# Patient Record
Sex: Male | Born: 1960 | Race: White | Hispanic: No | Marital: Married | Smoking: Current every day smoker
Health system: Southern US, Community
[De-identification: ages and names within clinical notes are randomized; demographics above are authoritative.]

## PROBLEM LIST (undated history)

## (undated) DIAGNOSIS — I10 Essential (primary) hypertension: Secondary | ICD-10-CM

## (undated) DIAGNOSIS — E7800 Pure hypercholesterolemia, unspecified: Secondary | ICD-10-CM

---

## 2010-07-18 ENCOUNTER — Emergency Department: Payer: Self-pay | Admitting: Emergency Medicine

## 2010-11-29 ENCOUNTER — Emergency Department: Payer: Self-pay | Admitting: *Deleted

## 2010-12-04 ENCOUNTER — Emergency Department: Payer: Self-pay | Admitting: Emergency Medicine

## 2011-06-24 ENCOUNTER — Emergency Department: Payer: Self-pay | Admitting: Emergency Medicine

## 2012-03-23 IMAGING — CR DG HAND COMPLETE 3+V*L*
1 series · 3 of 3 positions shown · non-contrast
Comparison: none

REASON FOR EXAM: 3rd finger and hand pain
COMMENTS:   May transport without cardiac monitor

PROCEDURE:     DXR - DXR HAND LT COMPLETE  W/OBLIQUES  - November 29, 2010  [DATE]
RESULT:     Images of the left hand demonstrate no fracture, dislocation or
radiopaque foreign body.

[Series 1: view not recorded · 0.17mm/px · 3 of 3 slices shown]
[im 1/3]
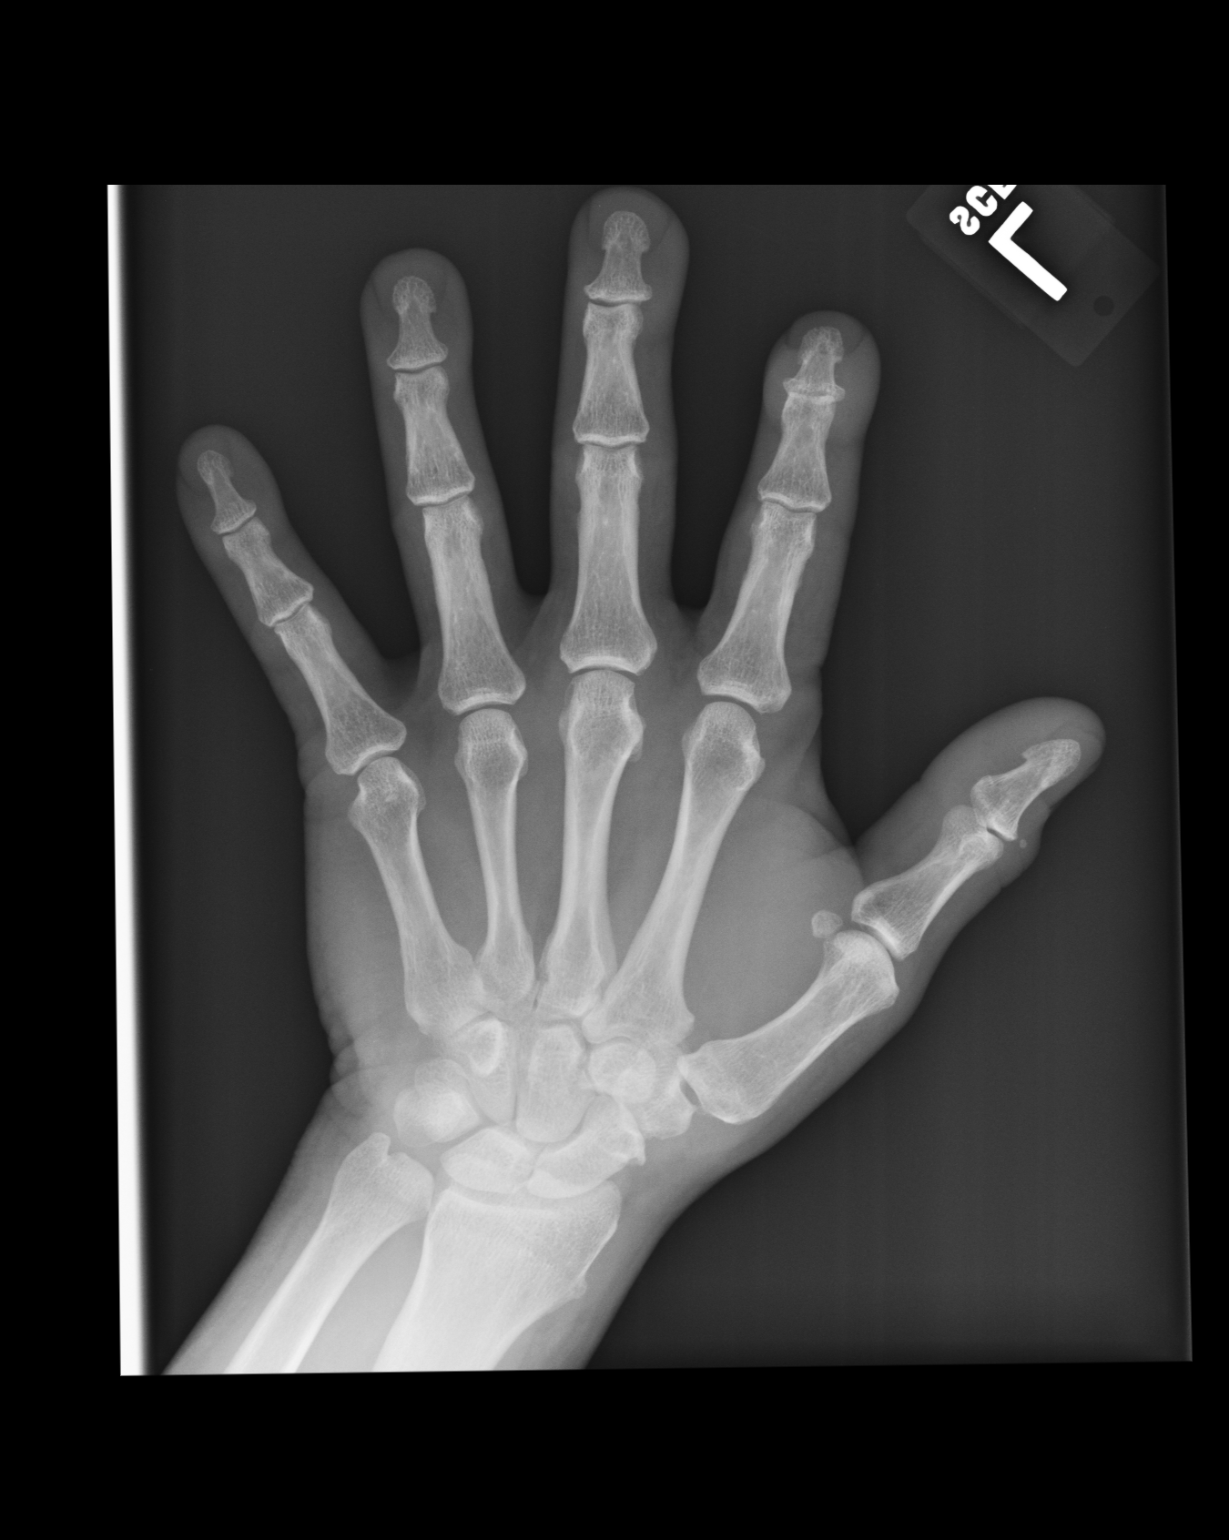
[im 2/3]
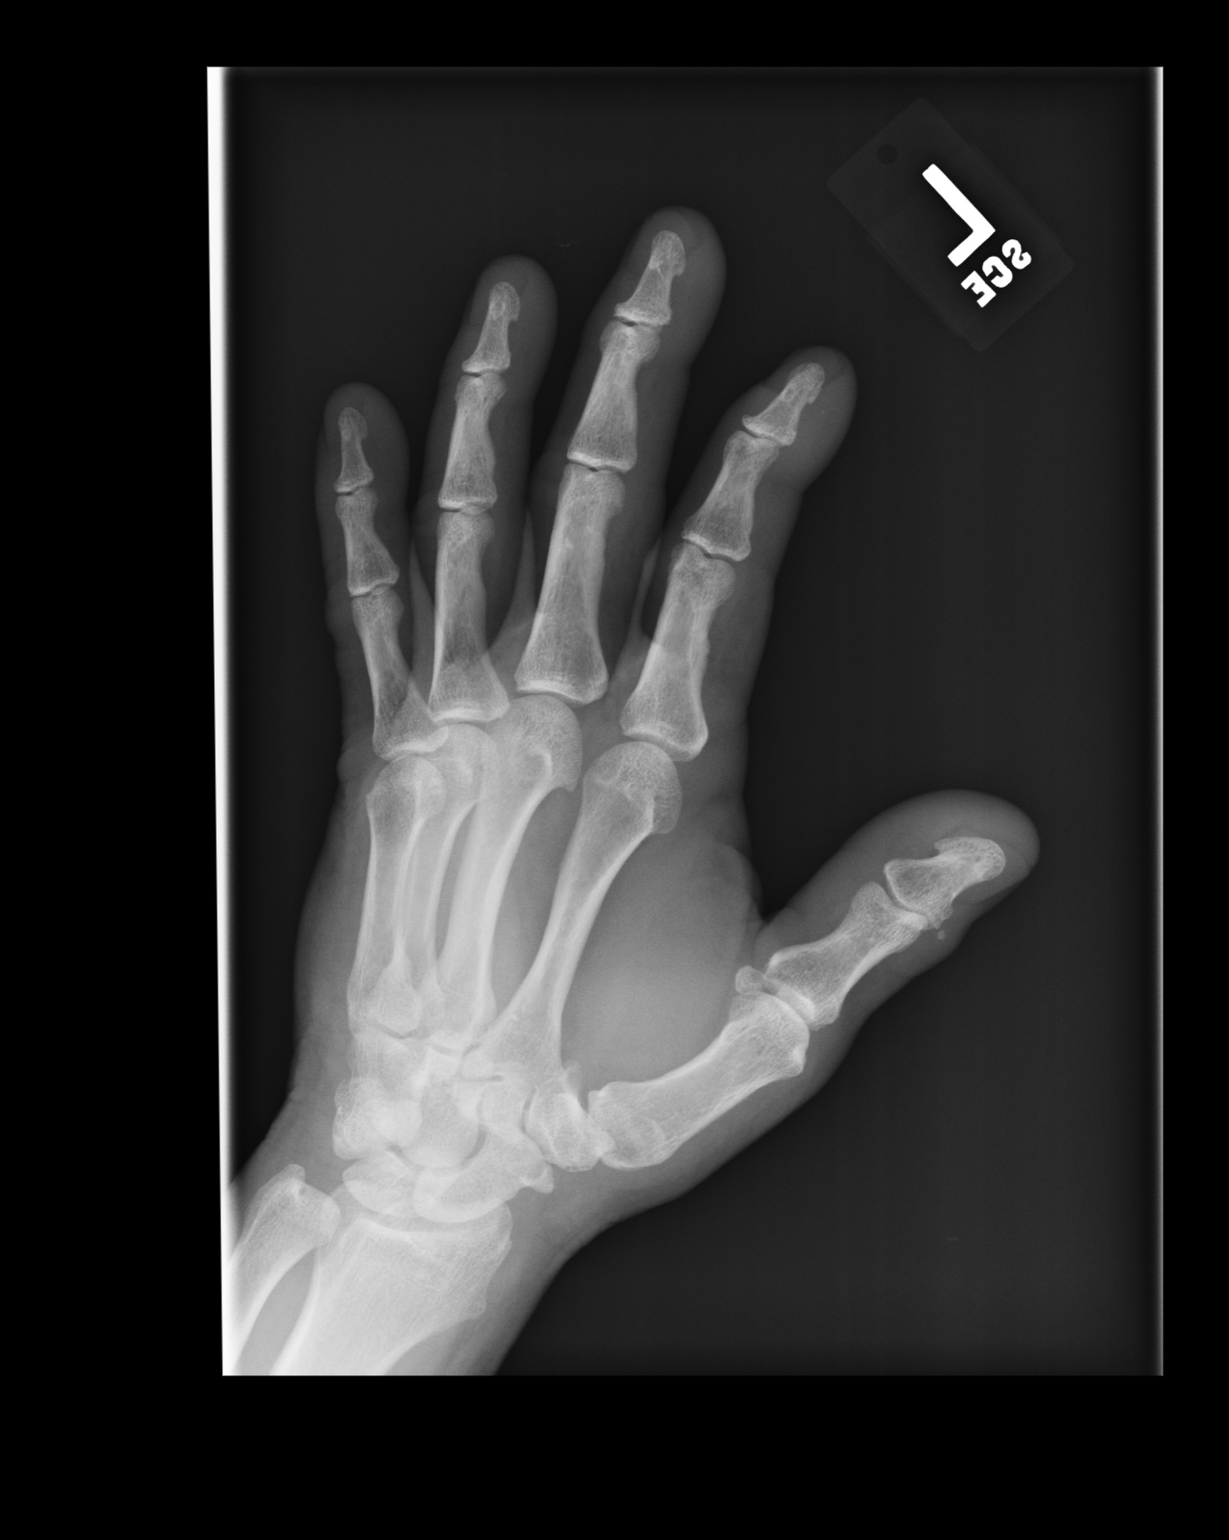
[im 3/3]
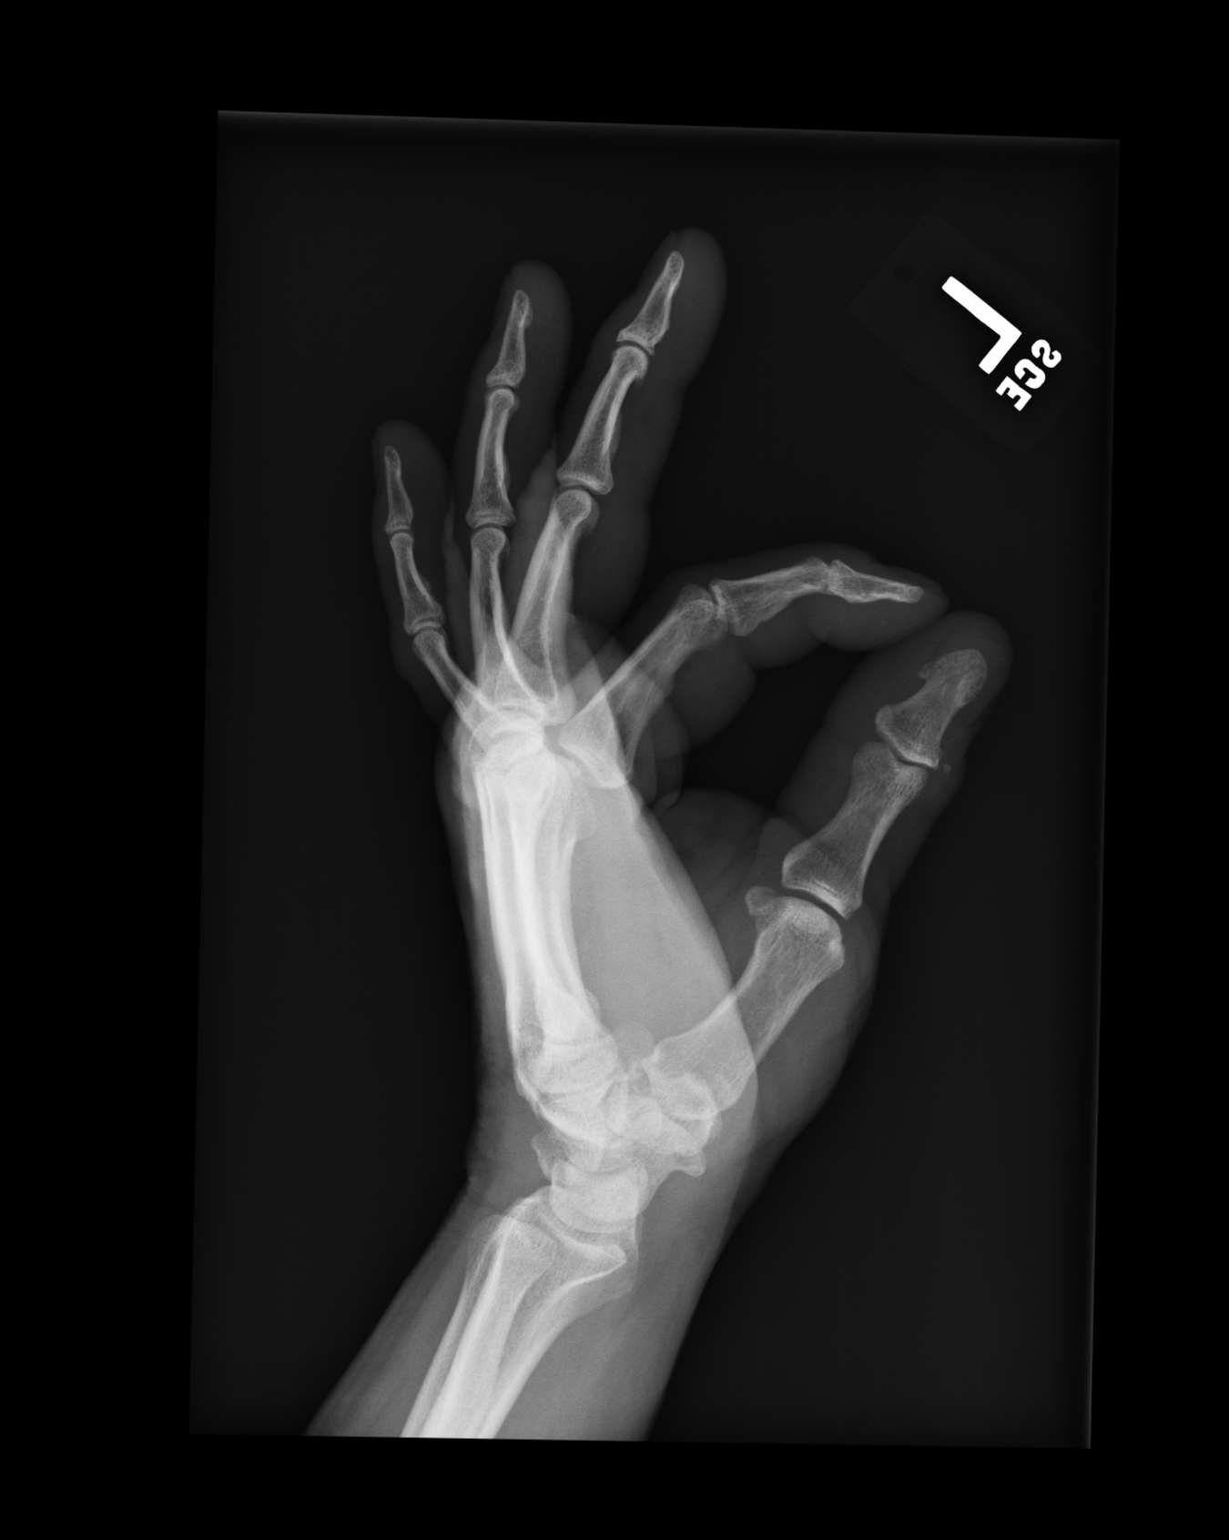

[3 of 3 positions shown; findings below may reference images not displayed]

IMPRESSION: No acute bony abnormality evident.

## 2012-10-16 IMAGING — CR DG CHEST 2V
1 series · 2 of 2 positions shown · non-contrast
Comparison: none

REASON FOR EXAM: weakness
COMMENTS:

PROCEDURE:     DXR - DXR CHEST PA (OR AP) AND LATERAL  - June 24, 2011  [DATE]
RESULT:     Comparison: None.

[Series 1: pa · 0.17mm/px · 2 of 2 slices shown]
[im 1/2]
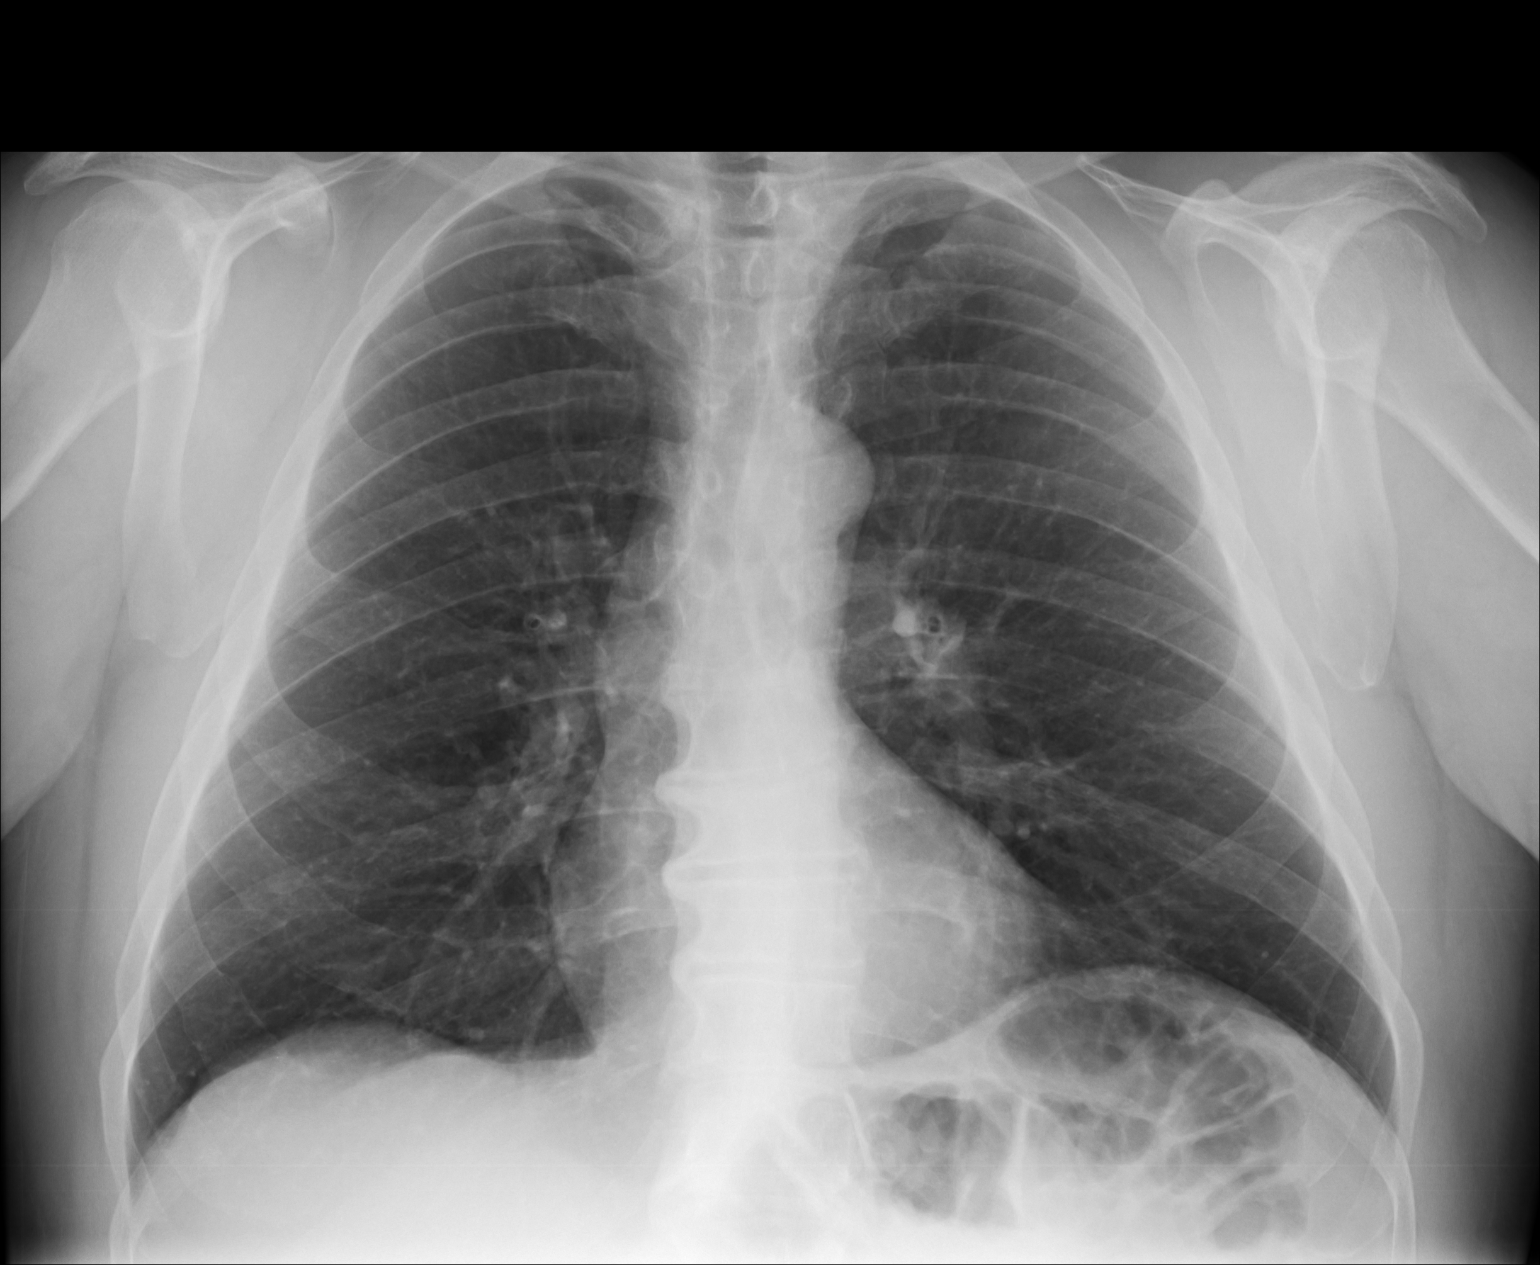
[im 2/2]
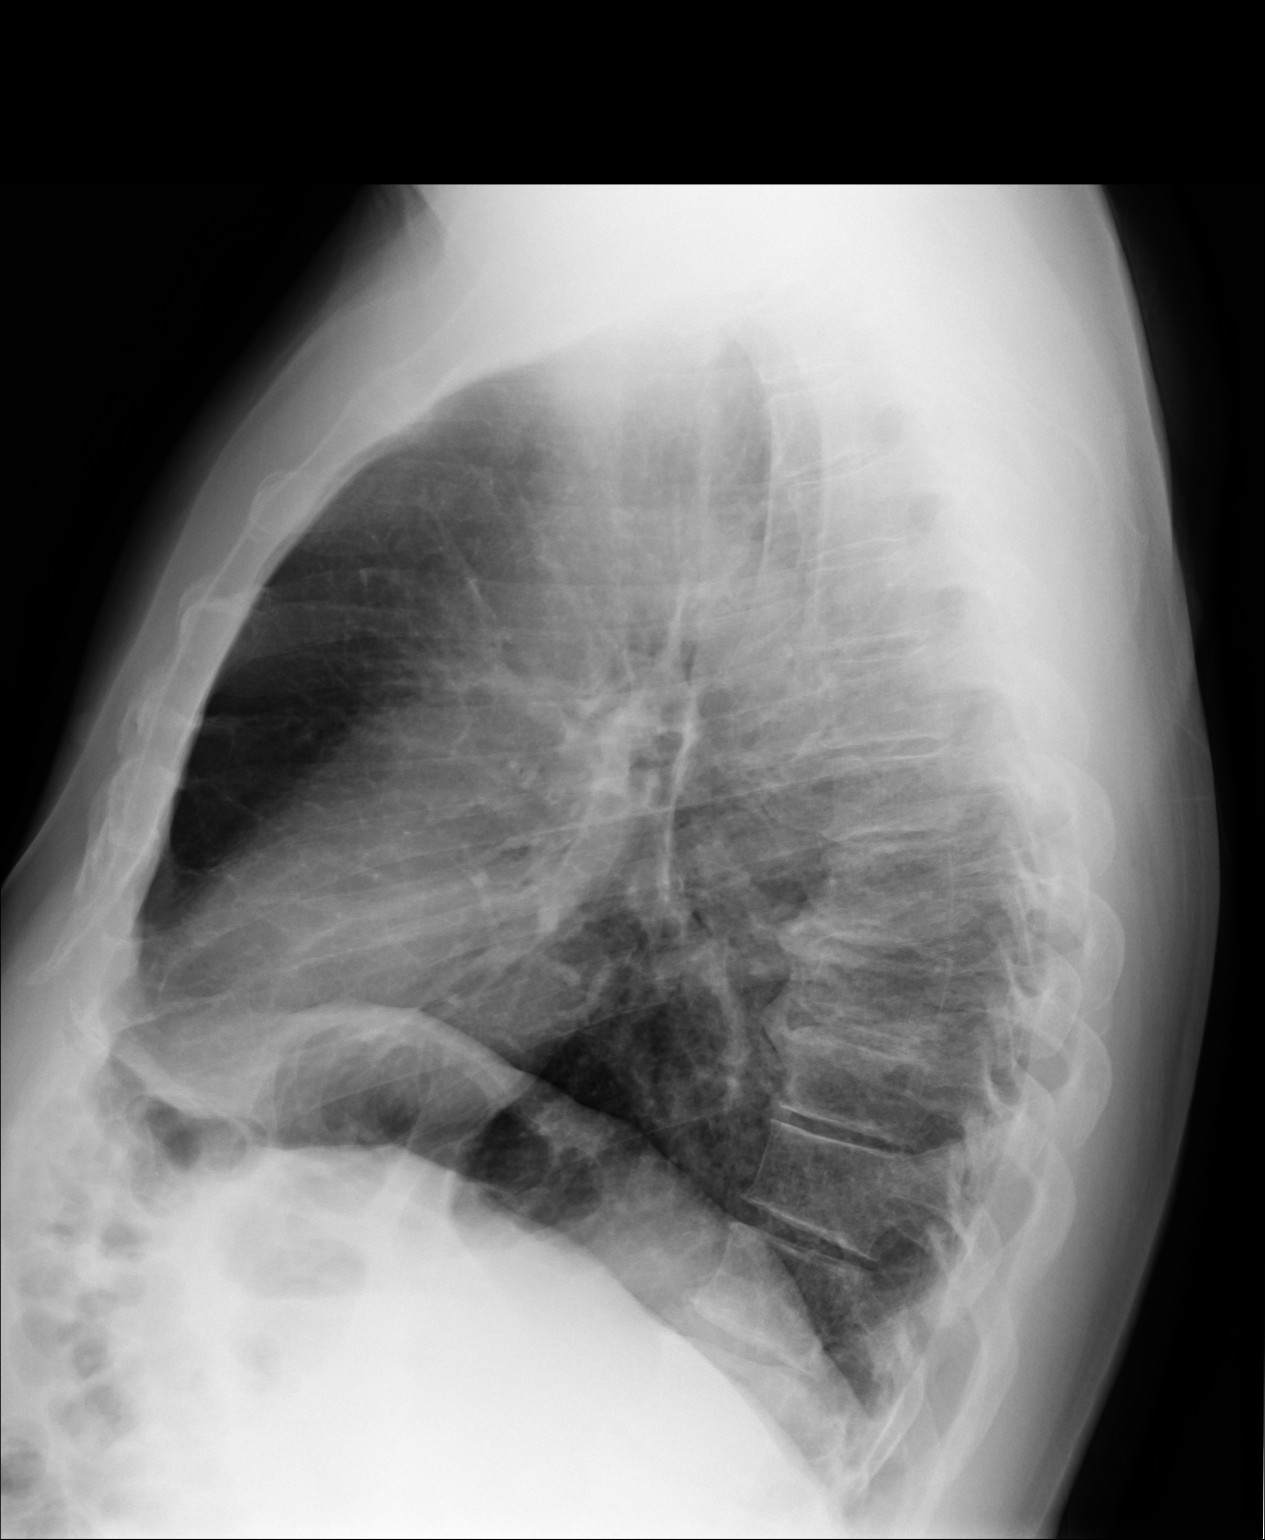

[2 of 2 positions shown; findings below may reference images not displayed]

FINDINGS: The heart and mediastinum are within normal limits. No focal pulmonary
opacities.
IMPRESSION: No acute cardiopulmonary disease.

## 2022-07-08 ENCOUNTER — Encounter: Payer: Self-pay | Admitting: Emergency Medicine

## 2022-07-08 ENCOUNTER — Other Ambulatory Visit: Payer: Self-pay

## 2022-07-08 ENCOUNTER — Ambulatory Visit
Admission: EM | Admit: 2022-07-08 | Discharge: 2022-07-08 | Disposition: A | Discharge status: Home / Self Care | Payer: Medicare HMO

## 2022-07-08 DIAGNOSIS — J441 Chronic obstructive pulmonary disease with (acute) exacerbation: Secondary | ICD-10-CM | POA: Diagnosis not present

## 2022-07-08 HISTORY — DX: Essential (primary) hypertension: I10

## 2022-07-08 HISTORY — DX: Pure hypercholesterolemia, unspecified: E78.00

## 2022-07-08 MED ORDER — PREDNISONE 20 MG PO TABS: 40.0000 mg | ORAL_TABLET | Freq: Every day | ORAL | 0 refills | Status: AC

## 2022-07-08 MED ORDER — BENZONATATE 100 MG PO CAPS: 100.0000 mg | ORAL_CAPSULE | Freq: Three times a day (TID) | ORAL | 0 refills | Status: AC

## 2022-07-08 MED ORDER — AZITHROMYCIN 250 MG PO TABS: 250.0000 mg | ORAL_TABLET | Freq: Every day | ORAL | 0 refills | Status: AC

## 2022-07-08 MED ORDER — PROMETHAZINE-DM 6.25-15 MG/5ML PO SYRP: 5.0000 mL | ORAL_SOLUTION | Freq: Four times a day (QID) | ORAL | 0 refills | Status: AC | PRN

## 2022-07-08 NOTE — Discharge Instructions (Signed)
Today you are being treated for inflammation to your upper airways related to your COPD  Begin azithromycin to provide coverage for bacteria which may be causing your symptoms to prolong  Begin use of prednisone every morning with food for 5 days to help reduce inflammation   You may use Tessalon Perles every 8 help calm your coughing  May use promethazine DM for coughing and additional comfort, be mindful this medication may make you drowsy  For worsening signs of breathing please go to the nearest emergency department for evaluation  In addition:  Maintaining adequate hydration may help to thin secretions and soothe the respiratory mucosa   Warm Liquids- Ingestion of warm liquids may have a soothing effect on the respiratory mucosa, increase the flow of nasal mucus, and loosen respiratory secretions, making them easier to remove  May try honey (2.5 to 5 mL [0.5 to 1 teaspoon]) can be given straight or diluted in liquid (juice). Corn syrup may be substituted if honey is not available.     If no improvement seen your symptoms or your breathing worsens please follow-up for reevaluation at that time you will most likely need a chest x-ray

## 2022-07-08 NOTE — ED Provider Notes (Signed)
[facility]    CSN: [id number] Arrival date & time: 07/08/22  1417      History   Chief Complaint Chief Complaint  Patient presents with   Cough    HPI Spencer Perry is a 62 y.o. male.   Patient presents for evaluation of a productive cough with [place] sputum present for 5 weeks.  Symptoms interfering with sleep, having increased fatigue this morning.  Has had a decreased appetite but tolerating some food and fluids.  No known sick contacts.  Shortness of breath and wheezing only experienced after coughing, resolved after few minutes.  History of COPD, taking inhaler as directed, no improvement seen.  Denies fever, chills, body aches, nasal congestion, ear pain or sore throat.    Past Medical History:  Diagnosis Date   High cholesterol    Hypertension     There are no problems to display for this patient.   History reviewed. No pertinent surgical history.     Home Medications    Prior to Admission medications   Medication Sig Start Date End Date Taking? Authorizing Provider  buPROPion (WELLBUTRIN XL) 150 MG 24 hr tablet Take 1 tablet by mouth every morning. 06/23/22  Yes [provider]  lisinopril (ZESTRIL) 10 MG tablet Take 1 tablet by mouth daily. 10/01/21 10/01/22 Yes [provider]  VENTOLIN HFA 108 (90 Base) MCG/ACT inhaler Inhale 2 puffs into the lungs every 4 (four) hours as needed. 11/25/19  Yes [provider]  amLODipine (NORVASC) 5 MG tablet Take 5 mg by mouth daily.    [provider]  atorvastatin (LIPITOR) 80 MG tablet Take 80 mg by mouth daily.    [provider]  gabapentin (NEURONTIN) 100 MG capsule Take by mouth.    [provider]    Family History History reviewed. No pertinent family history.  Social History Social History   Tobacco Use   Smoking status: Every Day    Types: Cigarettes   Smokeless tobacco: Never  Substance Use Topics   Alcohol use: Yes   Drug use: Yes     Types: Marijuana     Allergies   Patient has no known allergies.   Review of Systems Review of Systems Defer to HPI    Physical Exam Triage Vital Signs ED Triage Vitals  Enc Vitals Group     BP 07/08/22 1536 109/65     Pulse Rate 07/08/22 1536 83     Resp 07/08/22 1536 20     Temp 07/08/22 1536 98.7 F (37.1 C)     Temp Source 07/08/22 1536 Oral     SpO2 07/08/22 1536 97 %     Weight --      Height --      Head Circumference --      Peak Flow --      Pain Score 07/08/22 1531 2     Pain Loc --      Pain Edu? --      Excl. in [place]? --    No data found.  Updated Vital Signs BP 109/65 (BP Location: Right Arm)   Pulse 83   Temp 98.7 F (37.1 C) (Oral)   Resp 20   SpO2 97%   Visual Acuity Right Eye Distance:   Left Eye Distance:   Bilateral Distance:    Right Eye Near:   Left Eye Near:    Bilateral Near:     Physical Exam Constitutional:      Appearance: Normal  appearance.  HENT:     Head: Normocephalic.  Eyes:     Extraocular Movements: Extraocular movements intact.  Cardiovascular:     Rate and Rhythm: Normal rate and regular rhythm.     Pulses: Normal pulses.     Heart sounds: Normal heart sounds.  Pulmonary:     Effort: Pulmonary effort is normal.     Breath sounds: Wheezing present.  Neurological:     Mental Status: He is alert and oriented to person, place, and time.      UC Treatments / Results  Labs (all labs ordered are listed, but only abnormal results are displayed) Labs Reviewed - No data to display  EKG   Radiology No results found.  Procedures Procedures (including critical care time)  Medications Ordered in UC Medications - No data to display  Initial Impression / Assessment and Plan / UC Course  I have reviewed the triage vital signs and the nursing notes.  Pertinent labs & imaging results that were available during my care of the patient were reviewed by me and considered in my medical decision making (see chart for  details).  COPD exacerbation  Vital signs are stable, O2 saturation 97% on room air, wheezing heard throughout all lobes of the lungs, discussed findings with patient, symptoms have been present for 5 weeks offered chest x-ray, declined at this time, prescribed azithromycin, prednisone, Tessalon and Promethazine DM for outpatient management, given strict precautions that if symptoms worsen or persist past use of medicine he is to follow-up for reevaluation, verbalized understanding Final Clinical Impressions(s) / UC Diagnoses   Final diagnoses:  None   Discharge Instructions   None    ED Prescriptions   None    PDMP not reviewed this encounter.   Hans Eden, NP 07/08/22 1554

## 2022-07-08 NOTE — ED Triage Notes (Addendum)
Cough for 5 weeks,  patient reports white phlegm .  Cough intermittent and is worst at night.   Patient reports throat hurts with coughing.    Patient does not know if he has had fever.  Patient has taken cough medicine.    Asking questions about dental programs in community
